# Patient Record
Sex: Male | Born: 1980 | Hispanic: No | Marital: Single | Smoking: Never smoker
Health system: Southern US, Community
[De-identification: ages and names within clinical notes are randomized; demographics above are authoritative.]

---

## 2015-07-02 ENCOUNTER — Emergency Department (INDEPENDENT_AMBULATORY_CARE_PROVIDER_SITE_OTHER)
Admission: EM | Admit: 2015-07-02 | Discharge: 2015-07-02 | Disposition: A | Discharge status: Home / Self Care | Payer: BLUE CROSS/BLUE SHIELD | Source: Home / Self Care | Attending: Emergency Medicine | Admitting: Emergency Medicine

## 2015-07-02 ENCOUNTER — Encounter (HOSPITAL_COMMUNITY): Payer: Self-pay | Admitting: *Deleted

## 2015-07-02 DIAGNOSIS — H109 Unspecified conjunctivitis: Secondary | ICD-10-CM | POA: Diagnosis not present

## 2015-07-02 MED ORDER — SULFACETAMIDE SODIUM 10 % OP SOLN: 2.0000 [drp] | OPHTHALMIC | Status: AC

## 2015-07-02 NOTE — ED Notes (Signed)
Woke this AM with matting in left eye; c/o slight drainage, redness and irritation to left eye.  Slightly blurred vision in left eye.  Does not wear contact lenses.

## 2015-07-02 NOTE — ED Provider Notes (Signed)
CSN: [id number]     Arrival date & time 07/02/15  1635 History   First MD Initiated Contact with Patient 07/02/15 1721     Chief Complaint  Patient presents with  . Conjunctivitis   (Consider location/radiation/quality/duration/timing/severity/associated sxs/prior Treatment) HPI Itchy red non painful eye with drainage since yesterday, got up today and had to wipe crust off eye before it opened. Does not wear contacts, no sick kids around.  History reviewed. No pertinent past medical history. History reviewed. No pertinent past surgical history. No family history on file. Social History  Substance Use Topics  . Smoking status: Never Smoker   . Smokeless tobacco: None  . Alcohol Use: Yes     Comment: occasional    Review of Systems ROS +'ve "pink eye"  Denies: HEADACHE, NAUSEA, ABDOMINAL PAIN, CHEST PAIN, CONGESTION, DYSURIA, SHORTNESS OF BREATH  Allergies  Review of patient's allergies indicates no known allergies.  Home Medications   Prior to Admission medications   Not on File   Meds Ordered and Administered this Visit  Medications - No data to display  BP 103/66 mmHg  Pulse 61  Temp(Src) 97.9 F (36.6 C) (Oral)  SpO2 99% No data found.   Physical Exam  Constitutional: He appears well-developed and well-nourished. No distress.  HENT:  Head: Normocephalic and atraumatic.  Eyes: Left eye exhibits discharge. Left conjunctiva is injected. Left eye exhibits normal extraocular motion and no nystagmus. Left pupil is round and reactive.  Slit lamp exam:      The left eye shows no corneal abrasion and no fluorescein uptake.  Pulmonary/Chest: Effort normal.  Nursing note and vitals reviewed.   ED Course  Procedures (including critical care time)  Labs Review Labs Reviewed - No data to display  Imaging Review No results found.   Visual Acuity Review  Right Eye Distance:   Left Eye Distance:   Bilateral Distance:    Right Eye Near:   Left Eye Near:     Bilateral Near:         MDM   1. Conjunctivitis of left eye    Patient is advised to continue home symptomatic treatment. Prescription for bleph 10% sent pharmacy patient has indicated. Patient is advised that if there are new or worsening symptoms or attend the emergency department, or contact primary care provider. Instructions of care provided discharged home in stable condition. Return to work note provided.   THIS NOTE WAS GENERATED USING A VOICE RECOGNITION SOFTWARE PROGRAM. ALL REASONABLE EFFORTS  WERE MADE TO PROOFREAD THIS DOCUMENT FOR ACCURACY.     Tharon AquasFrank C Leya Paige, PA 07/02/15 1820

## 2015-07-02 NOTE — Discharge Instructions (Signed)

## 2017-10-17 ENCOUNTER — Emergency Department (HOSPITAL_COMMUNITY): Payer: BLUE CROSS/BLUE SHIELD

## 2017-10-17 ENCOUNTER — Other Ambulatory Visit: Payer: Self-pay

## 2017-10-17 ENCOUNTER — Encounter (HOSPITAL_COMMUNITY): Payer: Self-pay | Admitting: Emergency Medicine

## 2017-10-17 ENCOUNTER — Emergency Department (HOSPITAL_COMMUNITY)
Admission: EM | Admit: 2017-10-17 | Discharge: 2017-10-17 | Disposition: A | Discharge status: Home / Self Care | Payer: BLUE CROSS/BLUE SHIELD | Attending: Emergency Medicine | Admitting: Emergency Medicine

## 2017-10-17 DIAGNOSIS — R109 Unspecified abdominal pain: Secondary | ICD-10-CM | POA: Diagnosis present

## 2017-10-17 DIAGNOSIS — R911 Solitary pulmonary nodule: Secondary | ICD-10-CM | POA: Insufficient documentation

## 2017-10-17 DIAGNOSIS — N132 Hydronephrosis with renal and ureteral calculous obstruction: Secondary | ICD-10-CM | POA: Diagnosis not present

## 2017-10-17 LAB — URINALYSIS, ROUTINE W REFLEX MICROSCOPIC
BILIRUBIN URINE: NEGATIVE
Bacteria, UA: NONE SEEN
GLUCOSE, UA: NEGATIVE mg/dL
Ketones, ur: NEGATIVE mg/dL
Leukocytes, UA: NEGATIVE
NITRITE: NEGATIVE
PROTEIN: NEGATIVE mg/dL
SPECIFIC GRAVITY, URINE: 1.003 — AB (ref 1.005–1.030)
pH: 6 (ref 5.0–8.0)

## 2017-10-17 LAB — CBC
HEMATOCRIT: 41.4 % (ref 39.0–52.0)
Hemoglobin: 14.1 g/dL (ref 13.0–17.0)
MCH: 30.5 pg (ref 26.0–34.0)
MCHC: 34.1 g/dL (ref 30.0–36.0)
MCV: 89.4 fL (ref 78.0–100.0)
PLATELETS: 196 10*3/uL (ref 150–400)
RBC: 4.63 MIL/uL (ref 4.22–5.81)
RDW: 13.2 % (ref 11.5–15.5)
WBC: 6.4 10*3/uL (ref 4.0–10.5)

## 2017-10-17 LAB — BASIC METABOLIC PANEL
Anion gap: 8 (ref 5–15)
BUN: 14 mg/dL (ref 6–20)
CALCIUM: 9.5 mg/dL (ref 8.9–10.3)
CO2: 27 mmol/L (ref 22–32)
CREATININE: 0.94 mg/dL (ref 0.61–1.24)
Chloride: 100 mmol/L — ABNORMAL LOW (ref 101–111)
GFR calc Af Amer: >60 mL/min (ref >60)
GLUCOSE: 95 mg/dL (ref 65–99)
Potassium: 4.1 mmol/L (ref 3.5–5.1)
Sodium: 135 mmol/L (ref 135–145)

## 2017-10-17 MED ORDER — SODIUM CHLORIDE 0.9 % IV BOLUS
1000.0000 mL | Freq: Once | INTRAVENOUS | Status: AC
Start: 2017-10-17 — End: 2017-10-17
  Administered 2017-10-17: 1000 mL via INTRAVENOUS

## 2017-10-17 MED ORDER — KETOROLAC TROMETHAMINE 30 MG/ML IJ SOLN
15.0000 mg | Freq: Once | INTRAMUSCULAR | Status: AC
  Administered 2017-10-17: 15 mg via INTRAVENOUS
  Filled 2017-10-17: qty 1

## 2017-10-17 MED ORDER — HYDROMORPHONE HCL 2 MG/ML IJ SOLN
1.0000 mg | Freq: Once | INTRAMUSCULAR | Status: AC
  Administered 2017-10-17: 1 mg via INTRAVENOUS
  Filled 2017-10-17: qty 1

## 2017-10-17 MED ORDER — ONDANSETRON HCL 4 MG/2ML IJ SOLN
4.0000 mg | Freq: Once | INTRAMUSCULAR | Status: AC
  Administered 2017-10-17: 4 mg via INTRAVENOUS
  Filled 2017-10-17: qty 2

## 2017-10-17 MED ORDER — HYDROCODONE-ACETAMINOPHEN 5-325 MG PO TABS: 1.0000 | ORAL_TABLET | Freq: Four times a day (QID) | ORAL | 0 refills | Status: DC | PRN

## 2017-10-17 NOTE — ED Provider Notes (Signed)
[facility] EMERGENCY DEPARTMENT Provider Note   CSN: [id number] Arrival date & time: 10/17/17  4098     History   Chief Complaint Chief Complaint  Patient presents with  . Flank Pain    HPI Joshua Bridges is a 37 y.o. male.  The history is provided by the patient.  Flank Pain  This is a new problem. The current episode started yesterday. The problem occurs constantly. The problem has been rapidly worsening. Associated symptoms include abdominal pain and shortness of breath. Pertinent negatives include no chest pain. Nothing aggravates the symptoms. Nothing relieves the symptoms.  patient reports onset of left flank pain that radiates to the abdomen onset yesterday.  No fevers but reports nausea.  No chest pain.  He reports the pain makes him short of breath.  He has never had this before.  No history of abdominal pain surgery. Seen in urgent care and diagnosed with kidney stone, but he has not had any x-rays or CAT scans  PMH-none Home Medications    Prior to Admission medications   Medication Sig Start Date End Date Taking? Authorizing Provider  ciprofloxacin (CIPRO) 500 MG tablet Take 500 mg by mouth 2 (two) times daily. 10/16/17  Yes [provider]  ketorolac (TORADOL) 10 MG tablet Take 10 mg by mouth every 6 (six) hours as needed for moderate pain.  10/16/17  Yes [provider]  sulfacetamide (BLEPH-10) 10 % ophthalmic solution Place 2 drops into the left eye every 2 (two) hours while awake. Patient not taking: Reported on 10/17/2017 07/02/15   Tharon Aquas, PA    Family History No family history on file.  Social History Social History   Tobacco Use  . Smoking status: Never Smoker  . Smokeless tobacco: Never Used  Substance Use Topics  . Alcohol use: Yes    Comment: occasional  . Drug use: No     Allergies   Patient has no known allergies.   Review of Systems Review of Systems  Respiratory: Positive for shortness of  breath.   Cardiovascular: Negative for chest pain.  Gastrointestinal: Positive for abdominal pain.  Genitourinary: Positive for flank pain. Negative for testicular pain.  All other systems reviewed and are negative.    Physical Exam Updated Vital Signs BP 126/81 (BP Location: Left Arm)   Pulse 60   Temp (!) 97.5 F (36.4 C) (Oral)   Resp 20   Ht 1.676 m ( )   Wt 68 kg (150 lb)   SpO2 100%   BMI 24.21 kg/m   Physical Exam CONSTITUTIONAL: Ill-appearing and anxious HEAD: Normocephalic/atraumatic EYES: EOMI/PERRL ENMT: Mucous membranes moist NECK: supple no meningeal signs SPINE/BACK:entire spine nontender CV: S1/S2 noted, no murmurs/rubs/gallops noted LUNGS: Lungs are clear to auscultation bilaterally, no apparent distress ABDOMEN: soft, left lower quadrant abdominal tenderness, no rebound or guarding, bowel sounds noted throughout abdomen GU: Left Cva tenderness NEURO: Pt is awake/alert/appropriate, moves all extremitiesx4.  No facial droop.   EXTREMITIES: pulses normal/equal, full ROM SKIN: warm, color normal PSYCH: no abnormalities of mood noted, alert and oriented to situation   ED Treatments / Results  Labs (all labs ordered are listed, but only abnormal results are displayed) Labs Reviewed  URINALYSIS, ROUTINE W REFLEX MICROSCOPIC - Abnormal; Notable for the following components:      Result Value   Color, Urine STRAW (*)    Specific Gravity, Urine 1.003 (*)    Hgb urine dipstick SMALL (*)    All other  components within normal limits  BASIC METABOLIC PANEL - Abnormal; Notable for the following components:   Chloride 100 (*)    All other components within normal limits  CBC    EKG None  Radiology Ct Renal Stone Study  Result Date: 10/17/2017 CLINICAL DATA:  37 year old male with recent diagnosis of kidney stone and flank pain. EXAM: CT ABDOMEN AND PELVIS WITHOUT CONTRAST TECHNIQUE: Multidetector CT imaging of the abdomen and pelvis was performed  following the standard protocol without IV contrast. COMPARISON:  Lumbar spine radiograph dated 05/23/2015 FINDINGS: Evaluation of this exam is limited in the absence of intravenous contrast. Lower chest: The visualized lung bases are clear. There is a 3 mm nodule in the left lower lobe (series 3 image 11). No intra-abdominal free air or free fluid. Hepatobiliary: No focal liver abnormality is seen. No gallstones, gallbladder wall thickening, or biliary dilatation. Pancreas: Unremarkable. No pancreatic ductal dilatation or surrounding inflammatory changes. Spleen: Normal in size without focal abnormality. Adrenals/Urinary Tract: The adrenal glands are unremarkable. There is a 4 mm stone in the distal left ureter with mild left hydronephrosis. The right kidney is unremarkable. The visualized right ureter and urinary bladder appear unremarkable as well. Stomach/Bowel: Stomach is within normal limits. Appendix appears normal. No evidence of bowel wall thickening, distention, or inflammatory changes. Vascular/Lymphatic: The abdominal aorta and IVC are grossly unremarkable on this noncontrast CT. No portal venous gas. There is no adenopathy. Reproductive: The prostate and seminal vesicles are grossly unremarkable. No pelvic mass. Other: None Musculoskeletal: No acute or significant osseous findings. IMPRESSION: A 4 mm distal left ureteral calculus with mild left hydronephrosis. Electronically Signed   By: Elgie Collard M.D.   On: 10/17/2017 06:30    Procedures Procedures  Medications Ordered in ED Medications  HYDROmorphone (DILAUDID) injection 1 mg (1 mg Intravenous Given 10/17/17 0644)  ondansetron (ZOFRAN) injection 4 mg (4 mg Intravenous Given 10/17/17 0644)  ketorolac (TORADOL) 30 MG/ML injection 15 mg (15 mg Intravenous Given 10/17/17 0645)  sodium chloride 0.9 % bolus 1,000 mL (1,000 mLs Intravenous New Bag/Given 10/17/17 0647)     Initial Impression / Assessment and Plan / ED Course  I have reviewed the  triage vital signs and the nursing notes.  Pertinent labs  results that were available during my care of the patient were reviewed by me and considered in my medical decision making (see chart for details).     Patient here with flank pain abdominal pain with likely kidney stone.  CT confirms stone.  He is now improved after IV medicines.  He is already on Cipro from the urgent care He was informed of pulmonary nodule, he is a previous smoker.  He will need to have a follow-up CT chest in the next 6 to 12 months.  Patient understands this.  He has been given his paperwork with info on f/.  He is also been referred to urology  Narcotic database reviewed and considered in decision making.  Final Clinical Impressions(s) / ED Diagnoses   Final diagnoses:  Ureteral stone with hydronephrosis  Pulmonary nodule    ED Discharge Orders        Ordered    HYDROcodone-acetaminophen (NORCO/VICODIN) 5-325 MG tablet  Every 6 hours PRN     10/17/17 0802       Zadie Rhine, MD 10/17/17 [phone]

## 2017-10-17 NOTE — ED Notes (Signed)
Patient pacing hallway. Patient advised he was seen at [facility] and Dx with kidney stone has been taking the Toradol and is not helping.

## 2017-10-17 NOTE — Discharge Instructions (Signed)
As we discussed please have a follow-up CAT scan of your chest in 6 to 12 months to evaluate for any problems with your lungs.  You can see your  primary doctor for this issue

## 2017-10-17 NOTE — ED Triage Notes (Signed)
Pt reports he was dx with kidney stone yesterday at [facility]. Given px for PO Toradol and Cipro, pt states he has seen no change in pain.

## 2019-05-24 IMAGING — CT CT RENAL STONE PROTOCOL
2 of 4 series · 17 of 46 positions shown, 19 images · non-contrast
Comparison: Lumbar spine radiograph dated 05/23/2015

CLINICAL DATA: 36-year-old male with recent diagnosis of kidney
stone and flank pain.

EXAM:
CT ABDOMEN AND PELVIS WITHOUT CONTRAST
TECHNIQUE: Multidetector CT imaging of the abdomen and pelvis was performed
following the standard protocol without IV contrast.

[Series 3: renal stone 5.0 · axial · 0.75mm/px · z∈[+608,+1028]mm · 14 of 92 slices shown, 16 images]
[im 4/92  soft-tissue]
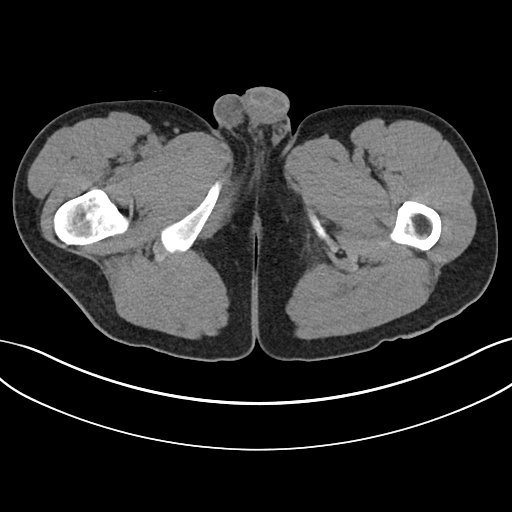
[im 4/92  bone]
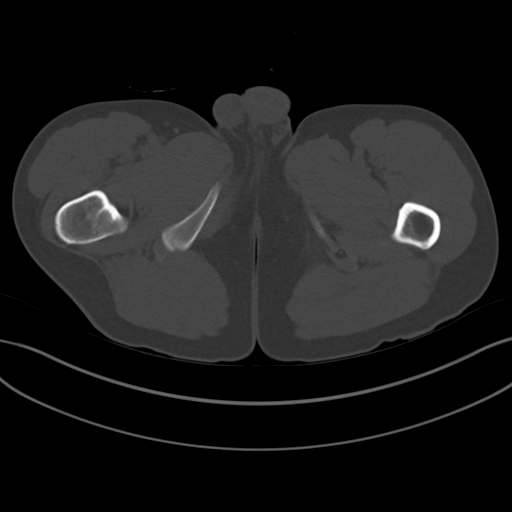
[im 12/92  soft-tissue]
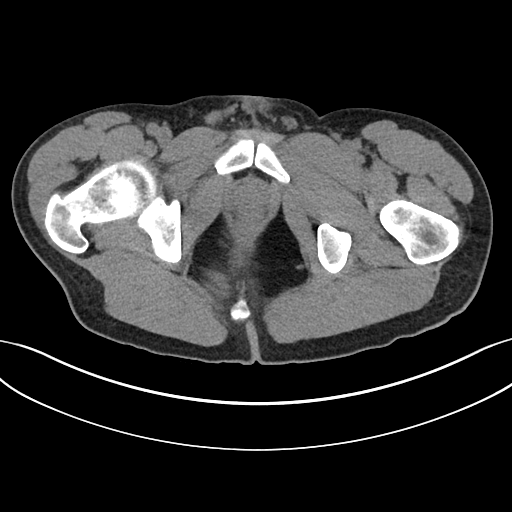
[im 19/92  soft-tissue]
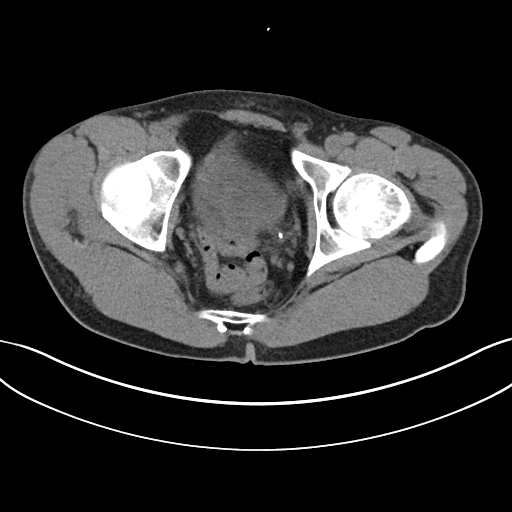
[im 23/92  soft-tissue]
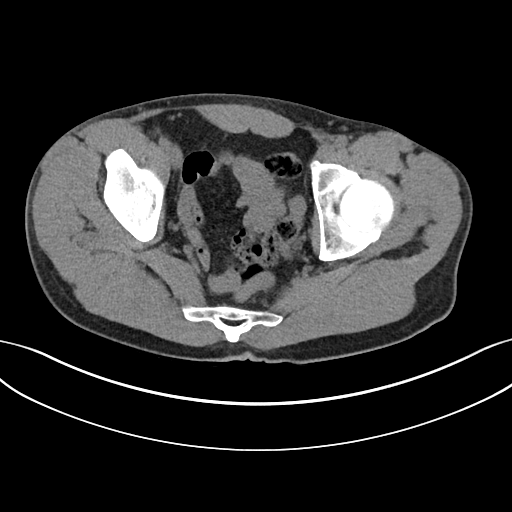
[im 31/92  soft-tissue]
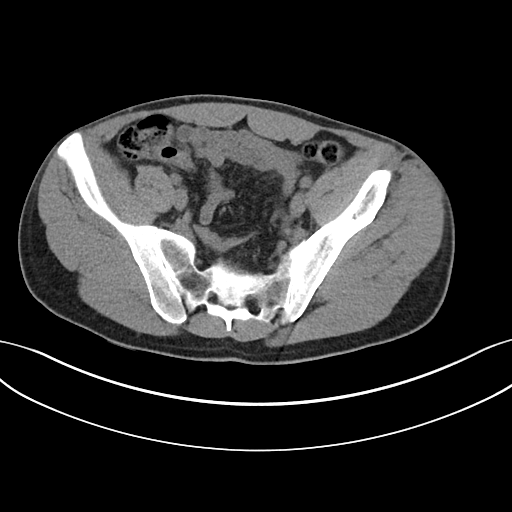
[im 38/92  soft-tissue]
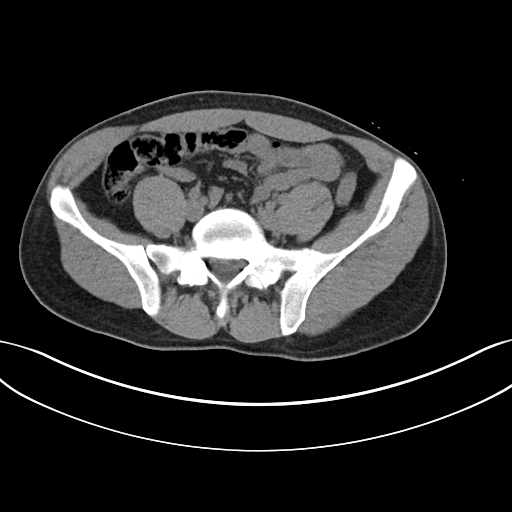
[im 42/92  soft-tissue]
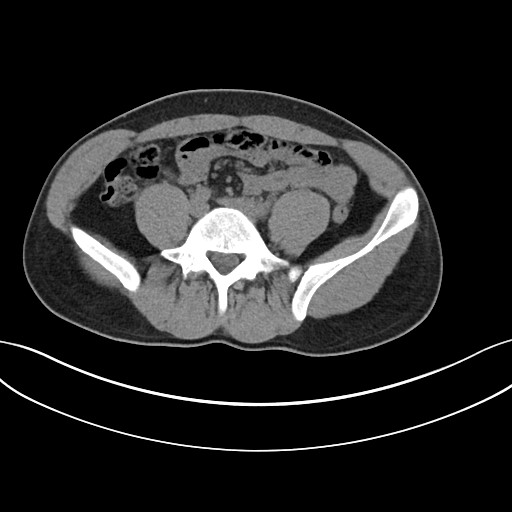
[im 50/92  soft-tissue]
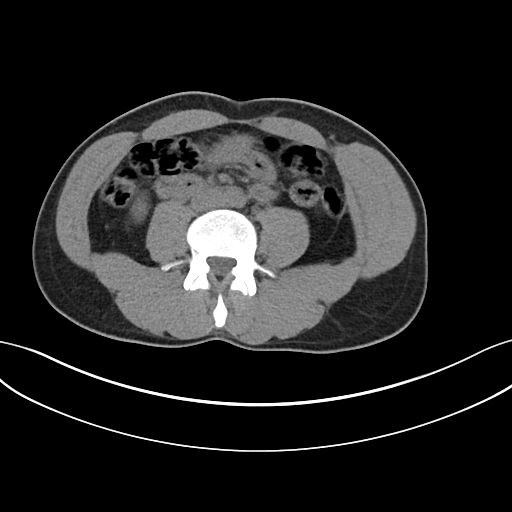
[im 54/92  soft-tissue]
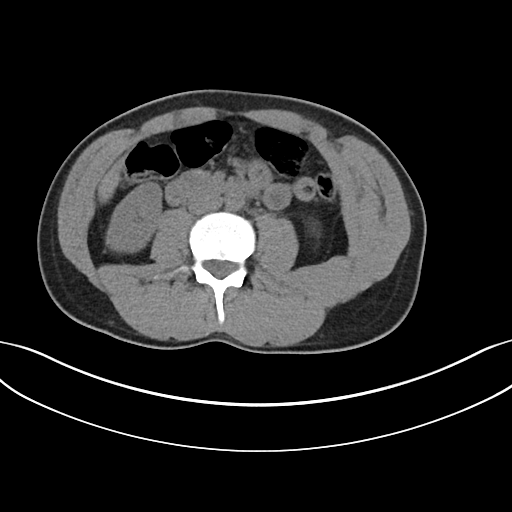
[im 54/92  bone]
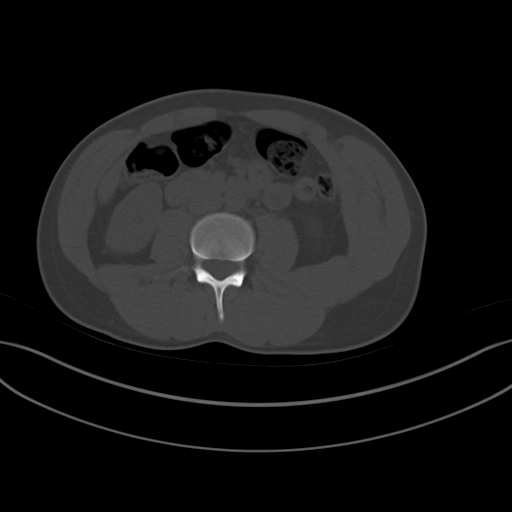
[im 61/92  soft-tissue]
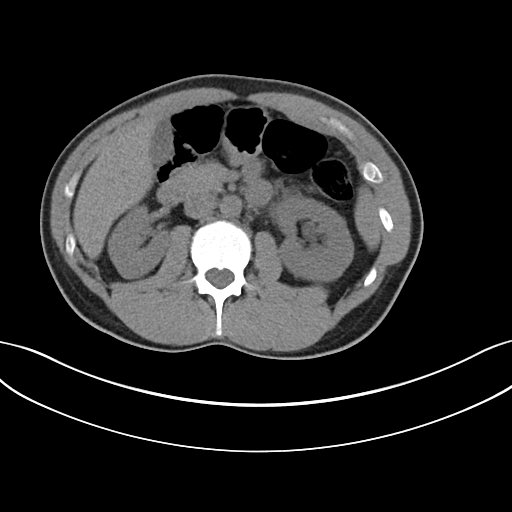
[im 69/92  soft-tissue]
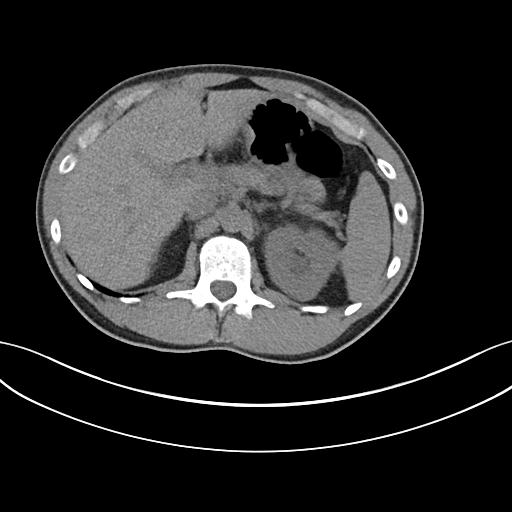
[im 73/92  soft-tissue]
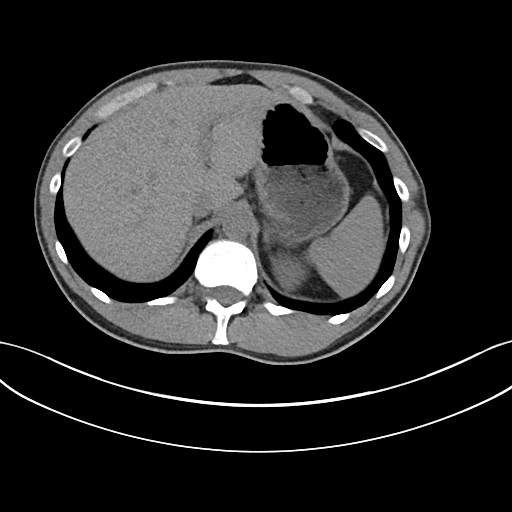
[im 80/92  soft-tissue]
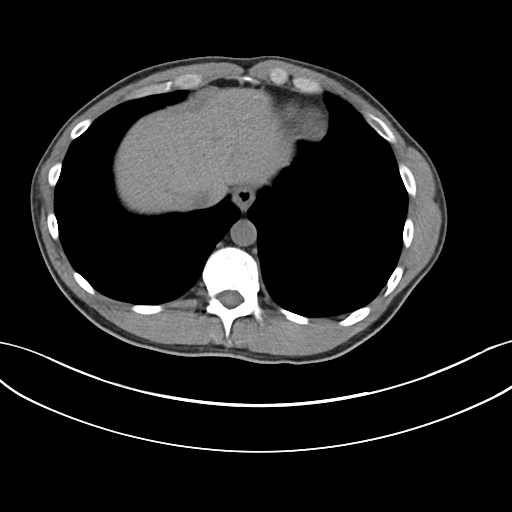
[im 88/92  soft-tissue]
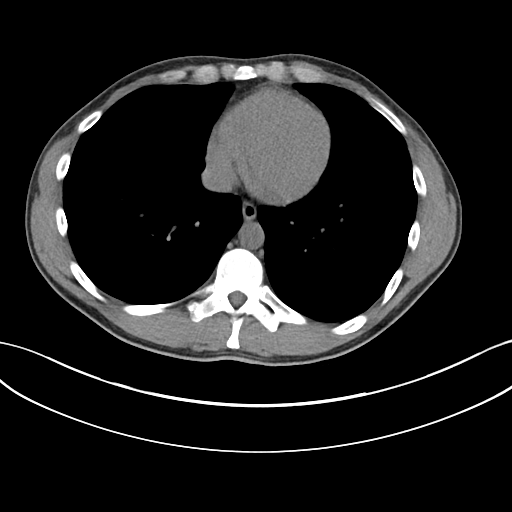

[Series 5: renal stone 3.0 cor · coronal · 0.85mm/px · 3 of 96 slices shown]
[im 32/96  soft-tissue]
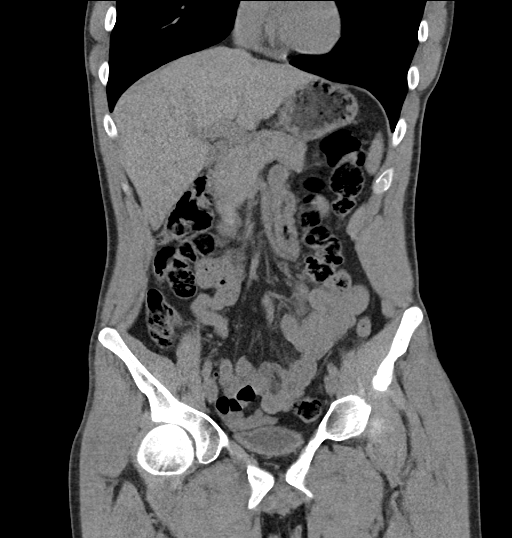
[im 43/96  soft-tissue]
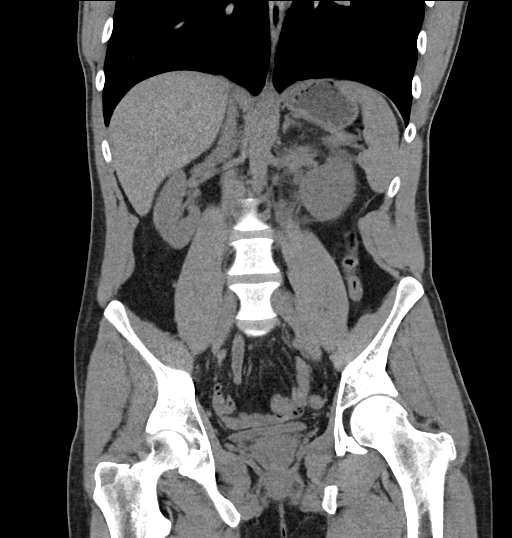
[im 53/96  soft-tissue]
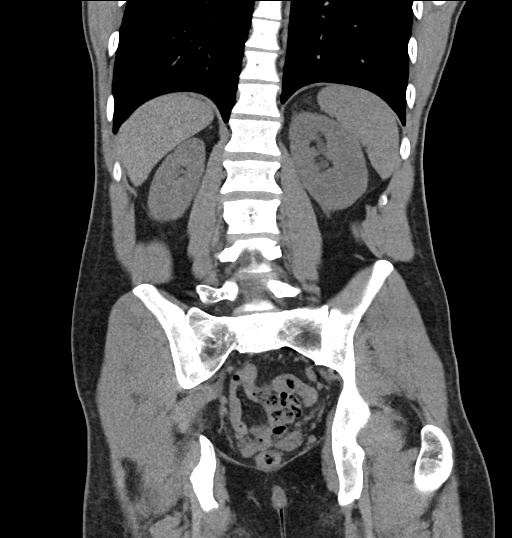

[17 of 46 positions shown; findings below may reference images not displayed]

FINDINGS: Evaluation of this exam is limited in the absence of intravenous
contrast.

Lower chest: The visualized lung bases are clear. There is a 3 mm
nodule in the left lower lobe (series 3 image 11).

No intra-abdominal free air or free fluid.

Hepatobiliary: No focal liver abnormality is seen. No gallstones,
gallbladder wall thickening, or biliary dilatation.

Pancreas: Unremarkable. No pancreatic ductal dilatation or
surrounding inflammatory changes.

Spleen: Normal in size without focal abnormality.

Adrenals/Urinary Tract: The adrenal glands are unremarkable. There
is a 4 mm stone in the distal left ureter with mild left
hydronephrosis. The right kidney is unremarkable. The visualized
right ureter and urinary bladder appear unremarkable as well.

Stomach/Bowel: Stomach is within normal limits. Appendix appears
normal. No evidence of bowel wall thickening, distention, or
inflammatory changes.

Vascular/Lymphatic: The abdominal aorta and IVC are grossly
unremarkable on this noncontrast CT. No portal venous gas. There is
no adenopathy.

Reproductive: The prostate and seminal vesicles are grossly
unremarkable. No pelvic mass.

Other: None

Musculoskeletal: No acute or significant osseous findings.
IMPRESSION: A 4 mm distal left ureteral calculus with mild left hydronephrosis.

## 2019-06-23 ENCOUNTER — Other Ambulatory Visit: Payer: Self-pay

## 2019-06-23 DIAGNOSIS — Z20822 Contact with and (suspected) exposure to covid-19: Secondary | ICD-10-CM

## 2019-06-25 LAB — NOVEL CORONAVIRUS, NAA: SARS-CoV-2, NAA: NOT DETECTED

## 2020-10-22 ENCOUNTER — Encounter (HOSPITAL_BASED_OUTPATIENT_CLINIC_OR_DEPARTMENT_OTHER): Payer: Self-pay | Admitting: Emergency Medicine

## 2020-10-22 ENCOUNTER — Emergency Department (HOSPITAL_BASED_OUTPATIENT_CLINIC_OR_DEPARTMENT_OTHER): Payer: BC Managed Care – PPO

## 2020-10-22 ENCOUNTER — Emergency Department (HOSPITAL_BASED_OUTPATIENT_CLINIC_OR_DEPARTMENT_OTHER)
Admission: EM | Admit: 2020-10-22 | Discharge: 2020-10-22 | Disposition: A | Discharge status: Home / Self Care | Payer: BC Managed Care – PPO | Attending: Emergency Medicine | Admitting: Emergency Medicine

## 2020-10-22 ENCOUNTER — Other Ambulatory Visit: Payer: Self-pay

## 2020-10-22 DIAGNOSIS — M549 Dorsalgia, unspecified: Secondary | ICD-10-CM | POA: Diagnosis not present

## 2020-10-22 DIAGNOSIS — Z87442 Personal history of urinary calculi: Secondary | ICD-10-CM | POA: Diagnosis not present

## 2020-10-22 DIAGNOSIS — N200 Calculus of kidney: Secondary | ICD-10-CM | POA: Diagnosis not present

## 2020-10-22 DIAGNOSIS — F17290 Nicotine dependence, other tobacco product, uncomplicated: Secondary | ICD-10-CM | POA: Insufficient documentation

## 2020-10-22 DIAGNOSIS — R109 Unspecified abdominal pain: Secondary | ICD-10-CM | POA: Diagnosis present

## 2020-10-22 DIAGNOSIS — N23 Unspecified renal colic: Secondary | ICD-10-CM

## 2020-10-22 LAB — COMPREHENSIVE METABOLIC PANEL
ALT: 34 U/L (ref 0–44)
AST: 32 U/L (ref 15–41)
Albumin: 4.1 g/dL (ref 3.5–5.0)
Alkaline Phosphatase: 44 U/L (ref 38–126)
Anion gap: 8 (ref 5–15)
BUN: 15 mg/dL (ref 6–20)
CO2: 27 mmol/L (ref 22–32)
Calcium: 9.1 mg/dL (ref 8.9–10.3)
Chloride: 101 mmol/L (ref 98–111)
Creatinine, Ser: 0.93 mg/dL (ref 0.61–1.24)
GFR, Estimated: >60 mL/min (ref >60)
Glucose, Bld: 109 mg/dL — ABNORMAL HIGH (ref 70–99)
Potassium: 3.8 mmol/L (ref 3.5–5.1)
Sodium: 136 mmol/L (ref 135–145)
Total Bilirubin: 0.7 mg/dL (ref 0.3–1.2)
Total Protein: 6.5 g/dL (ref 6.5–8.1)

## 2020-10-22 LAB — CBC WITH DIFFERENTIAL/PLATELET
Abs Immature Granulocytes: 0.01 10*3/uL (ref 0.00–0.07)
Basophils Absolute: 0 10*3/uL (ref 0.0–0.1)
Basophils Relative: 0 %
Eosinophils Absolute: 0 10*3/uL (ref 0.0–0.5)
Eosinophils Relative: 0 %
HCT: 43.7 % (ref 39.0–52.0)
Hemoglobin: 14.9 g/dL (ref 13.0–17.0)
Immature Granulocytes: 0 %
Lymphocytes Relative: 5 %
Lymphs Abs: 0.3 10*3/uL — ABNORMAL LOW (ref 0.7–4.0)
MCH: 30.7 pg (ref 26.0–34.0)
MCHC: 34.1 g/dL (ref 30.0–36.0)
MCV: 89.9 fL (ref 80.0–100.0)
Monocytes Absolute: 0.5 10*3/uL (ref 0.1–1.0)
Monocytes Relative: 7 %
Neutro Abs: 5.6 10*3/uL (ref 1.7–7.7)
Neutrophils Relative %: 88 %
Platelets: 178 10*3/uL (ref 150–400)
RBC: 4.86 MIL/uL (ref 4.22–5.81)
RDW: 12.5 % (ref 11.5–15.5)
WBC: 6.4 10*3/uL (ref 4.0–10.5)
nRBC: 0 % (ref 0.0–0.2)

## 2020-10-22 LAB — URINALYSIS, ROUTINE W REFLEX MICROSCOPIC
Bilirubin Urine: NEGATIVE
Glucose, UA: NEGATIVE mg/dL
Hgb urine dipstick: NEGATIVE
Ketones, ur: NEGATIVE mg/dL
Leukocytes,Ua: NEGATIVE
Nitrite: NEGATIVE
Protein, ur: NEGATIVE mg/dL
Specific Gravity, Urine: 1.025 (ref 1.005–1.030)
pH: 5.5 (ref 5.0–8.0)

## 2020-10-22 MED ORDER — MORPHINE SULFATE (PF) 4 MG/ML IV SOLN
4.0000 mg | Freq: Once | INTRAVENOUS | Status: AC
  Administered 2020-10-22: 4 mg via INTRAVENOUS
  Filled 2020-10-22: qty 1

## 2020-10-22 MED ORDER — HYDROCODONE-ACETAMINOPHEN 5-325 MG PO TABS: 1.0000 | ORAL_TABLET | Freq: Four times a day (QID) | ORAL | 0 refills | Status: AC | PRN

## 2020-10-22 MED ORDER — IBUPROFEN 600 MG PO TABS: 600.0000 mg | ORAL_TABLET | Freq: Four times a day (QID) | ORAL | 0 refills | Status: AC | PRN

## 2020-10-22 MED ORDER — TAMSULOSIN HCL 0.4 MG PO CAPS: 0.4000 mg | ORAL_CAPSULE | Freq: Every day | ORAL | 0 refills | Status: AC

## 2020-10-22 MED ORDER — KETOROLAC TROMETHAMINE 30 MG/ML IJ SOLN
30.0000 mg | Freq: Once | INTRAMUSCULAR | Status: AC
Start: 2020-10-22 — End: 2020-10-22
  Administered 2020-10-22: 30 mg via INTRAVENOUS
  Filled 2020-10-22: qty 1

## 2020-10-22 MED ORDER — SODIUM CHLORIDE 0.9 % IV BOLUS
1000.0000 mL | Freq: Once | INTRAVENOUS | Status: AC
  Administered 2020-10-22: 1000 mL via INTRAVENOUS

## 2020-10-22 NOTE — ED Provider Notes (Signed)
[facility] EMERGENCY DEPARTMENT Provider Note   CSN: [id number] Arrival date & time: 10/22/20  1854     History Chief Complaint  Patient presents with  . Back Pain    Joshua Bridges is a 40 y.o. male here with back pain and flank pain.  Patient states that he has been having worsening right flank pain started today.  He states that it is sharp and reminded him of his kidney stone.  He had a kidney stone in 19-Oct-2017 that passed on its own.  Patient denies any blood in his urine.  Denies any fever or chills or vomiting.   The history is provided by the patient.       History reviewed. No pertinent past medical history.  There are no problems to display for this patient.   History reviewed. No pertinent surgical history.     History reviewed. No pertinent family history.  Social History   Tobacco Use  . Smoking status: Never Smoker  . Smokeless tobacco: Current User    Types: Chew  Substance Use Topics  . Alcohol use: Not Currently    Comment: occasional  . Drug use: No    Home Medications Prior to Admission medications   Medication Sig Start Date End Date Taking? Authorizing Provider  ciprofloxacin (CIPRO) 500 MG tablet Take 500 mg by mouth 2 (two) times daily. 10/16/17   [provider]  HYDROcodone-acetaminophen (NORCO/VICODIN) 5-325 MG tablet Take 1 tablet by mouth every 6 (six) hours as needed for severe pain. 10/17/17   Zadie Rhine, MD  ketorolac (TORADOL) 10 MG tablet Take 10 mg by mouth every 6 (six) hours as needed for moderate pain.  10/16/17   [provider]  sulfacetamide (BLEPH-10) 10 % ophthalmic solution Place 2 drops into the left eye every 2 (two) hours while awake. Patient not taking: Reported on 10/17/2017 07/02/15   Tharon Aquas, PA    Allergies    Patient has no known allergies.  Review of Systems   Review of Systems  Genitourinary: Positive for flank pain.  All other systems reviewed and are negative.   Physical  Exam Updated Vital Signs BP 111/66 (BP Location: Left Arm)   Pulse (!) 105   Temp 99.9 F (37.7 C) (Oral)   Resp 20   Ht 5\' 8"  (1.727 m)   Wt 65.8 kg   SpO2 99%   BMI 22.05 kg/m   Physical Exam Vitals and nursing note reviewed.  Constitutional:      Appearance: Normal appearance.  HENT:     Head: Normocephalic.     Nose: Nose normal.     Mouth/Throat:     Mouth: Mucous membranes are moist.  Eyes:     Extraocular Movements: Extraocular movements intact.     Pupils: Pupils are equal, round, and reactive to light.  Cardiovascular:     Rate and Rhythm: Normal rate and regular rhythm.     Pulses: Normal pulses.     Heart sounds: Normal heart sounds.  Pulmonary:     Effort: Pulmonary effort is normal.     Breath sounds: Normal breath sounds.  Abdominal:     General: Abdomen is flat.     Palpations: Abdomen is soft.     Comments: + R CVAT   Musculoskeletal:        General: Normal range of motion.     Cervical back: Normal range of motion and neck supple.  Skin:    General: Skin is  warm.     Capillary Refill: Capillary refill takes less than 2 seconds.  Neurological:     General: No focal deficit present.     Mental Status: He is alert and oriented to person, place, and time.  Psychiatric:        Mood and Affect: Mood normal.        Behavior: Behavior normal.     ED Results / Procedures / Treatments   Labs (all labs ordered are listed, but only abnormal results are displayed) Labs Reviewed  CBC WITH DIFFERENTIAL/PLATELET - Abnormal; Notable for the following components:      Result Value   Lymphs Abs 0.3 (*)    All other components within normal limits  COMPREHENSIVE METABOLIC PANEL - Abnormal; Notable for the following components:   Glucose, Bld 109 (*)    All other components within normal limits  URINALYSIS, ROUTINE W REFLEX MICROSCOPIC    EKG None  Radiology CT Renal Stone Study  Result Date: 10/22/2020 CLINICAL DATA:  Right flank pain. EXAM: CT  ABDOMEN AND PELVIS WITHOUT CONTRAST TECHNIQUE: Multidetector CT imaging of the abdomen and pelvis was performed following the standard protocol without IV contrast. COMPARISON:  Oct 17, 2017 FINDINGS: Lower chest: No acute abnormality. Hepatobiliary: No focal liver abnormality is seen. No gallstones, gallbladder wall thickening, or biliary dilatation. Pancreas: Unremarkable. No pancreatic ductal dilatation or surrounding inflammatory changes. Spleen: Normal in size without focal abnormality. Adrenals/Urinary Tract: Adrenal glands are unremarkable. Kidneys are normal in size, without focal lesions. Bilateral 2 mm nonobstructing renal stones are seen. There is no evidence of hydronephrosis. Bladder is unremarkable. Stomach/Bowel: Stomach is within normal limits. Appendix appears normal. No evidence of bowel wall thickening, distention, or inflammatory changes. Vascular/Lymphatic: No significant vascular findings are present. No enlarged abdominal or pelvic lymph nodes. Reproductive: Prostate is unremarkable. Other: No abdominal wall hernia or abnormality. No abdominopelvic ascites. Musculoskeletal: No acute or significant osseous findings. IMPRESSION: Bilateral 2 mm nonobstructing renal stones. Electronically Signed   By: Aram Candela M.D.   On: 10/22/2020 20:46    Procedures Procedures   Medications Ordered in ED Medications  sodium chloride 0.9 % bolus 1,000 mL (0 mLs Intravenous Stopped 10/22/20 2048)  ketorolac (TORADOL) 30 MG/ML injection 30 mg (30 mg Intravenous Given 10/22/20 1933)  morphine 4 MG/ML injection 4 mg (4 mg Intravenous Given 10/22/20 1934)    ED Course  I have reviewed the triage vital signs and the nursing notes.  Pertinent labs & imaging results that were available during my care of the patient were reviewed by me and considered in my medical decision making (see chart for details).    MDM Rules/Calculators/A&P                         Joshua Bridges is a 40 y.o. male here with  right flank pain.  Patient has a history of kidney stone.  I think likely has recurrent renal colic versus [place].  Plan to get CBC and CMP and urinalysis and CT renal stone.  Will hydrate and give pain meds and reassess  8:56 PM Labs unremarkable.  CT showed bilateral nonobstructing stones with no hydro.  I wonder if he passed a kidney stone.  Pain is controlled right now.  We will give a couple doses of hydrocodone for pain.  I told him to take around-the-clock Motrin and will prescribe Flomax.  Follow-up with urology as needed.   Final Clinical Impression(s) / ED  Diagnoses Final diagnoses:  None    Rx / DC Orders ED Discharge Orders    None       Charlynne Pander, MD 10/22/20 [phone]

## 2020-10-22 NOTE — Discharge Instructions (Signed)
You have some stones inside your kidney.  I think you likely passed a kidney stone  Take Motrin every 6 hours for pain  Take hydrocodone as needed if you have severe pain.  Take Flomax daily  Follow-up with urology.  Stay hydrated.  Return to ER if you have worse flank pain, abdominal pain, vomiting, fever

## 2020-10-22 NOTE — ED Triage Notes (Signed)
Pt arrives pov with c/o bilateral lower back pain, R side pain worse. denies injury, denies dysuria. Hx of kidney stones
